# Patient Record
Sex: Male | Born: 1965 | Race: White | Hispanic: No | Marital: Married | Smoking: Never smoker
Health system: Southern US, Community
[De-identification: ages and names within clinical notes are randomized; demographics above are authoritative.]

## PROBLEM LIST (undated history)

## (undated) DIAGNOSIS — Z8669 Personal history of other diseases of the nervous system and sense organs: Secondary | ICD-10-CM

## (undated) DIAGNOSIS — E785 Hyperlipidemia, unspecified: Secondary | ICD-10-CM

## (undated) DIAGNOSIS — M51369 Other intervertebral disc degeneration, lumbar region without mention of lumbar back pain or lower extremity pain: Secondary | ICD-10-CM

## (undated) DIAGNOSIS — T7840XA Allergy, unspecified, initial encounter: Secondary | ICD-10-CM

## (undated) DIAGNOSIS — M5136 Other intervertebral disc degeneration, lumbar region: Secondary | ICD-10-CM

## (undated) HISTORY — DX: Hyperlipidemia, unspecified: E78.5

## (undated) HISTORY — DX: Other intervertebral disc degeneration, lumbar region: M51.36

## (undated) HISTORY — DX: Other intervertebral disc degeneration, lumbar region without mention of lumbar back pain or lower extremity pain: M51.369

## (undated) HISTORY — DX: Allergy, unspecified, initial encounter: T78.40XA

## (undated) HISTORY — PX: WISDOM TOOTH EXTRACTION: SHX21

## (undated) HISTORY — DX: Personal history of other diseases of the nervous system and sense organs: Z86.69

---

## 2004-08-01 ENCOUNTER — Emergency Department (HOSPITAL_COMMUNITY): Admission: EM | Admit: 2004-08-01 | Discharge: 2004-08-01 | Payer: Self-pay | Admitting: Internal Medicine

## 2005-08-18 IMAGING — CR DG FOREARM 2V*L*
3 series · 3 of 3 positions shown · non-contrast
Comparison: none

CLINICAL DATA: Question piece of Hans-Gert lodged in the soft tissues of the forearm.
 LEFT FOREARM (TWO VIEWS):
 Two view exam of the left forearm was obtained with a screw marking the location of the puncture wound.  Deep to this level is a 5 x 3 mm retained radiopaque foreign body.  No underlying bony defect identified.  
 IMPRESSION
 5 mm retained radiopaque foreign body within the anterolateral soft tissues of the proximal-mid forearm

[view not recorded (1 of 3)]
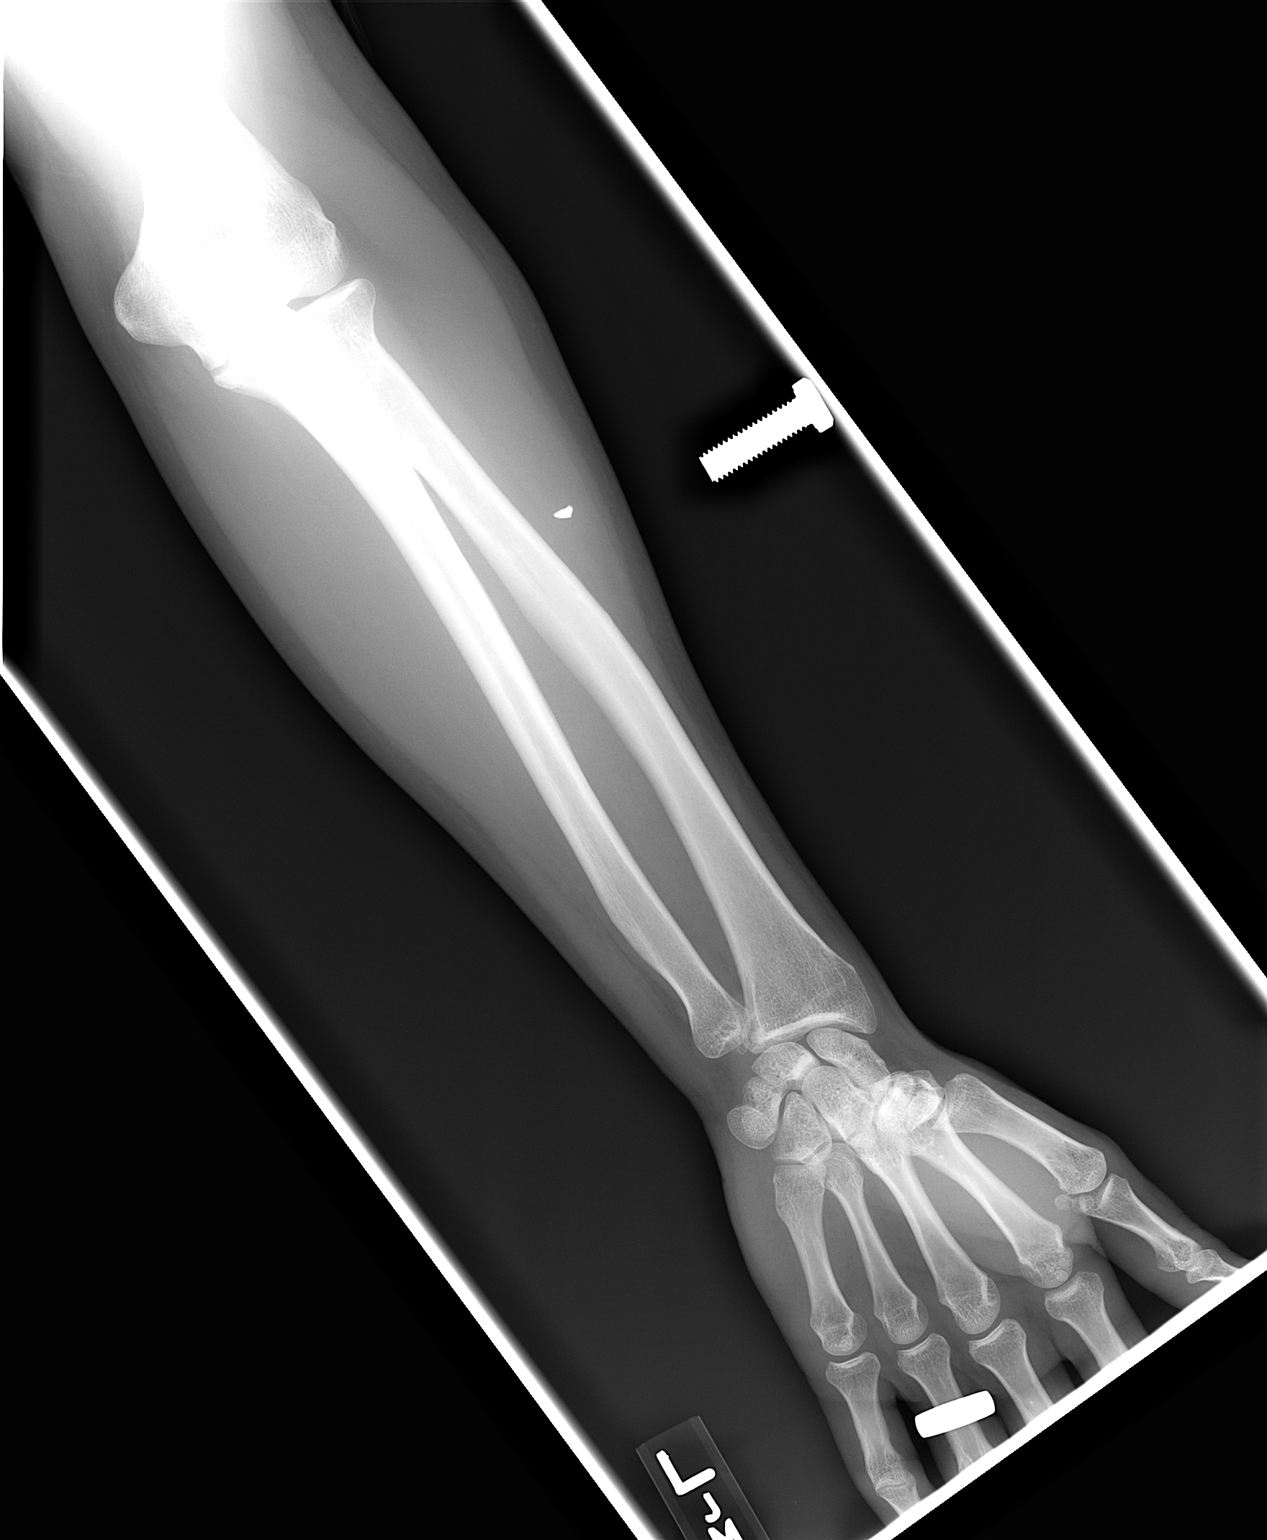

[view not recorded (2 of 3)]
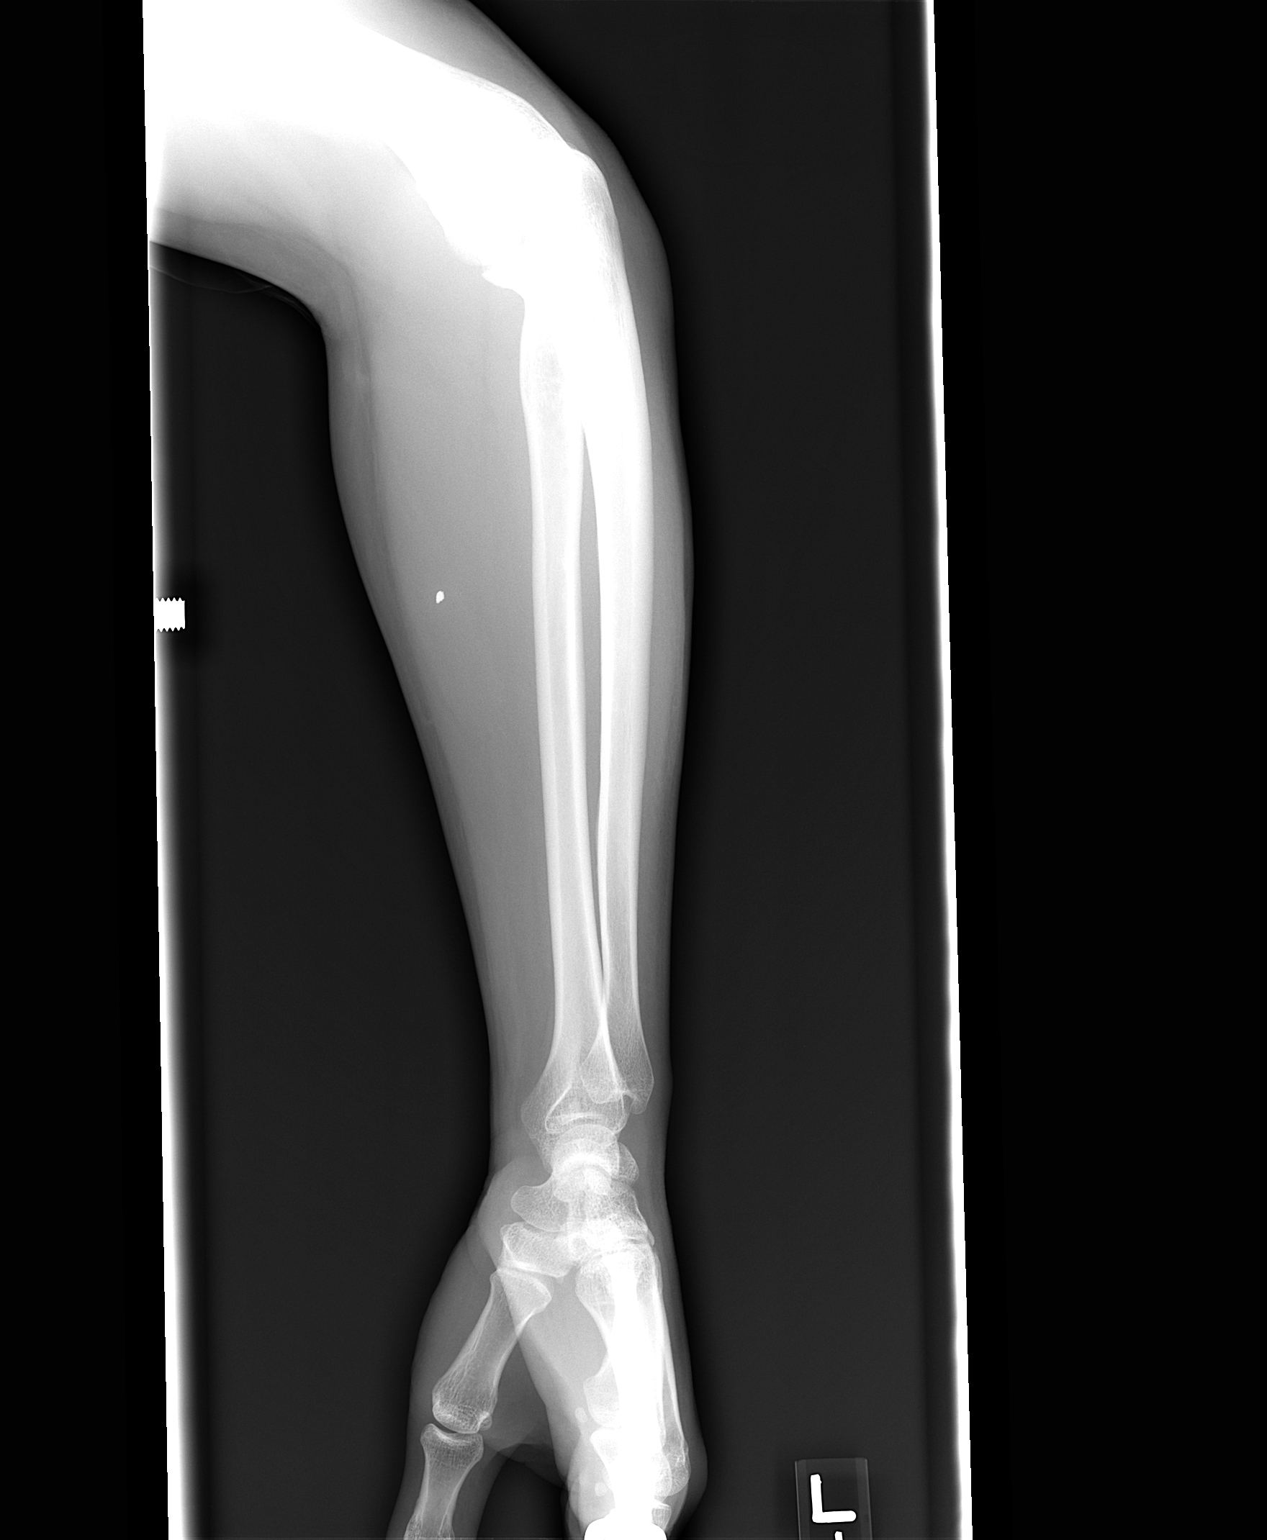

[view not recorded (3 of 3)]
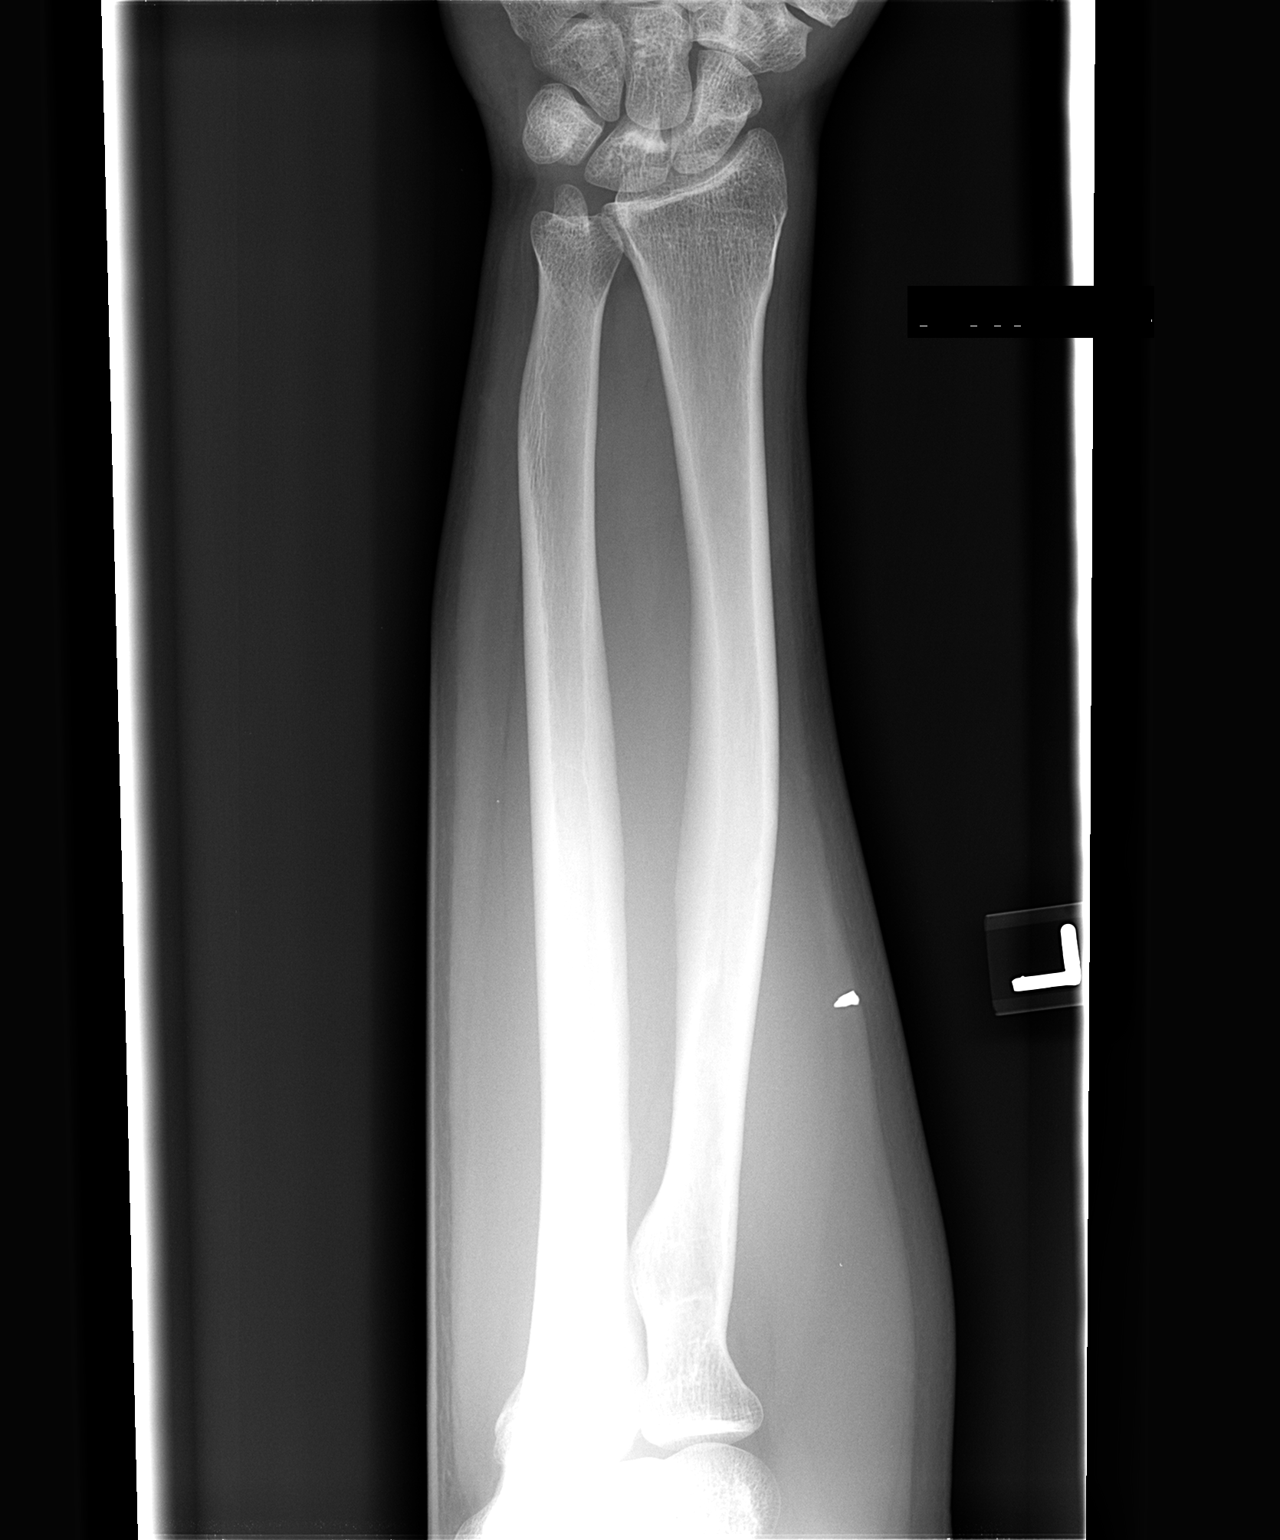

[3 of 3 positions shown; findings below may reference images not displayed]

## 2007-07-31 ENCOUNTER — Ambulatory Visit: Payer: Self-pay | Admitting: Internal Medicine

## 2009-05-02 ENCOUNTER — Ambulatory Visit: Payer: Self-pay | Admitting: Internal Medicine

## 2009-05-02 DIAGNOSIS — E782 Mixed hyperlipidemia: Secondary | ICD-10-CM | POA: Insufficient documentation

## 2009-05-03 ENCOUNTER — Encounter: Payer: Self-pay | Admitting: Internal Medicine

## 2009-05-06 ENCOUNTER — Encounter (INDEPENDENT_AMBULATORY_CARE_PROVIDER_SITE_OTHER): Payer: Self-pay | Admitting: *Deleted

## 2009-12-06 HISTORY — PX: BACK SURGERY: SHX140

## 2010-03-23 ENCOUNTER — Telehealth (INDEPENDENT_AMBULATORY_CARE_PROVIDER_SITE_OTHER): Payer: Self-pay | Admitting: *Deleted

## 2010-03-25 ENCOUNTER — Encounter (INDEPENDENT_AMBULATORY_CARE_PROVIDER_SITE_OTHER): Payer: Self-pay | Admitting: *Deleted

## 2010-09-14 ENCOUNTER — Ambulatory Visit (HOSPITAL_COMMUNITY)
Admission: RE | Admit: 2010-09-14 | Discharge: 2010-09-14 | Payer: Self-pay | Admitting: Physical Medicine and Rehabilitation

## 2011-01-03 LAB — CONVERTED CEMR LAB
ALT: 20 units/L (ref 0–53)
ALT: 29 units/L (ref 0–53)
AST: 23 units/L (ref 0–37)
AST: 27 units/L (ref 0–37)
Albumin: 4.2 g/dL (ref 3.5–5.2)
Basophils Absolute: 0.1 10*3/uL (ref 0.0–0.1)
Bilirubin Urine: NEGATIVE
Blood in Urine, dipstick: NEGATIVE
CO2: 24 meq/L (ref 19–32)
Calcium: 10.1 mg/dL (ref 8.4–10.5)
Chloride: 102 meq/L (ref 96–112)
Chloride: 103 meq/L (ref 96–112)
Creatinine, Ser: 0.9 mg/dL (ref 0.40–1.50)
Eosinophils Absolute: 0.1 10*3/uL (ref 0.0–0.6)
Eosinophils Absolute: 0.1 10*3/uL (ref 0.0–0.7)
Eosinophils Relative: 2 % (ref 0–5)
Eosinophils Relative: 2.3 % (ref 0.0–5.0)
GFR calc non Af Amer: 114 mL/min
Glucose, Bld: 96 mg/dL (ref 70–99)
HCT: 44.3 % (ref 39.0–52.0)
HDL: 47.1 mg/dL (ref >39.0)
Indirect Bilirubin: 1.4 mg/dL — ABNORMAL HIGH (ref 0.0–0.9)
Lymphs Abs: 2.1 10*3/uL (ref 0.7–4.0)
MCV: 83.3 fL (ref 78.0–100.0)
MCV: 86.5 fL (ref 78.0–100.0)
Monocytes Relative: 9 % (ref 3–12)
Neutrophils Relative %: 58 % (ref 43–77)
Platelets: 200 10*3/uL (ref 150–400)
Potassium: 4.2 meq/L (ref 3.5–5.3)
Protein, U semiquant: NEGATIVE
RBC: 5.07 M/uL (ref 4.22–5.81)
RBC: 5.32 M/uL (ref 4.22–5.81)
Total CHOL/HDL Ratio: 4.4
Total Protein: 7.5 g/dL (ref 6.0–8.3)
Triglycerides: 83 mg/dL (ref 0–149)
Urobilinogen, UA: 0.2
WBC: 5.5 10*3/uL (ref 4.5–10.5)
WBC: 6.8 10*3/uL (ref 4.0–10.5)

## 2011-01-06 NOTE — Progress Notes (Signed)
Summary: CALL-A-NURSE letter mailed unable to contact pt  Phone Note Outgoing Call   Call placed by: Jeremy Johann CMA,  March 23, 2010 8:38 AM Summary of Call: [facility] Triage Call Report Triage Record Num: [id number] Operator: Dayton Martes Patient Name: Marcus [facility] Call Date & Time: 03/21/2010 11:27:53AM Patient Phone: [phone] PCP: Marga Melnick Patient Gender: Male PCP Fax : Patient DOB: 19-Jan-1966 Practice Name: [facility] Reason for Call: Wife/Susan sts that pt has yellow discharge from the L eye and the sclera is red, eyelid is moderately red and severely swollen. Afebrile. Denies urgent emergent sx's. Care adv given. Adv UC now. 1145-Notified Dr. Tawanna Cooler of the above. T.O. Adv that the office policy is that no meds are called in without pt being seen and he will need to be seen in UC. RN adv pt and wife per T.O. Protocol(s) Used: Eye: Infection / Irritation Recommended Outcome per Protocol: See Provider within 4 hours Reason for Outcome: Pain with tenderness, swelling or redness above or below eyes or near ears over sinuses Care Advice:  ~ Another adult should drive if vision is impaired.  ~ Call provider if symptoms worsen or new symptoms develop.  ~ List, or take, all current prescription(s), OTC or alternative medication(s) to provider for evaluation.  ~ SYMPTOM / CONDITION MANAGEMENT 04/  Follow-up for Phone Call        left message to call office...........Marland KitchenFelecia Deloach CMA  March 23, 2010 8:41 AM   left message to call office at work number and number taking by call nurse to have pt return call. Pt home phone number with constant busy signal .Jeremy Johann CMA  March 23, 2010 12:35 PM + left message to call  office............Marland KitchenFelecia Deloach CMA  March 24, 2010 8:51 AM  left message to call office. tried to contact pt several times with no return call. letter mailed informing pt to call office if still need help with this  matter............Marland KitchenFelecia Deloach CMA  March 25, 2010 9:19 AM

## 2011-01-06 NOTE — Letter (Signed)
Summary: Generic Letter  [facility]  [address] [place], [place] [address]   Phone: [phone]  Fax: [phone]    03/25/2010    [facility] [address] [place], [place]  [address]    Dear Mr. Dungee,    This letter is to inform you of our several attempts to contact you. The initial call was in regards to a message taken by our on call service.If this matter in which call was originally in reference to is still a issue please contact our office so that we may assist you with this matter.           Sincerely,       Marga Melnick, MD

## 2011-07-16 ENCOUNTER — Encounter: Payer: Self-pay | Admitting: Internal Medicine

## 2011-10-01 IMAGING — CR DG ORBITS COMPLETE 4+V
2 series · 2 of 2 positions shown · non-contrast
Comparison: None

CLINICAL DATA: Metal working/exposure; clearance prior to MRI

ORBITS - COMPLETE 4+ VIEW

[w waters (1 of 2)]
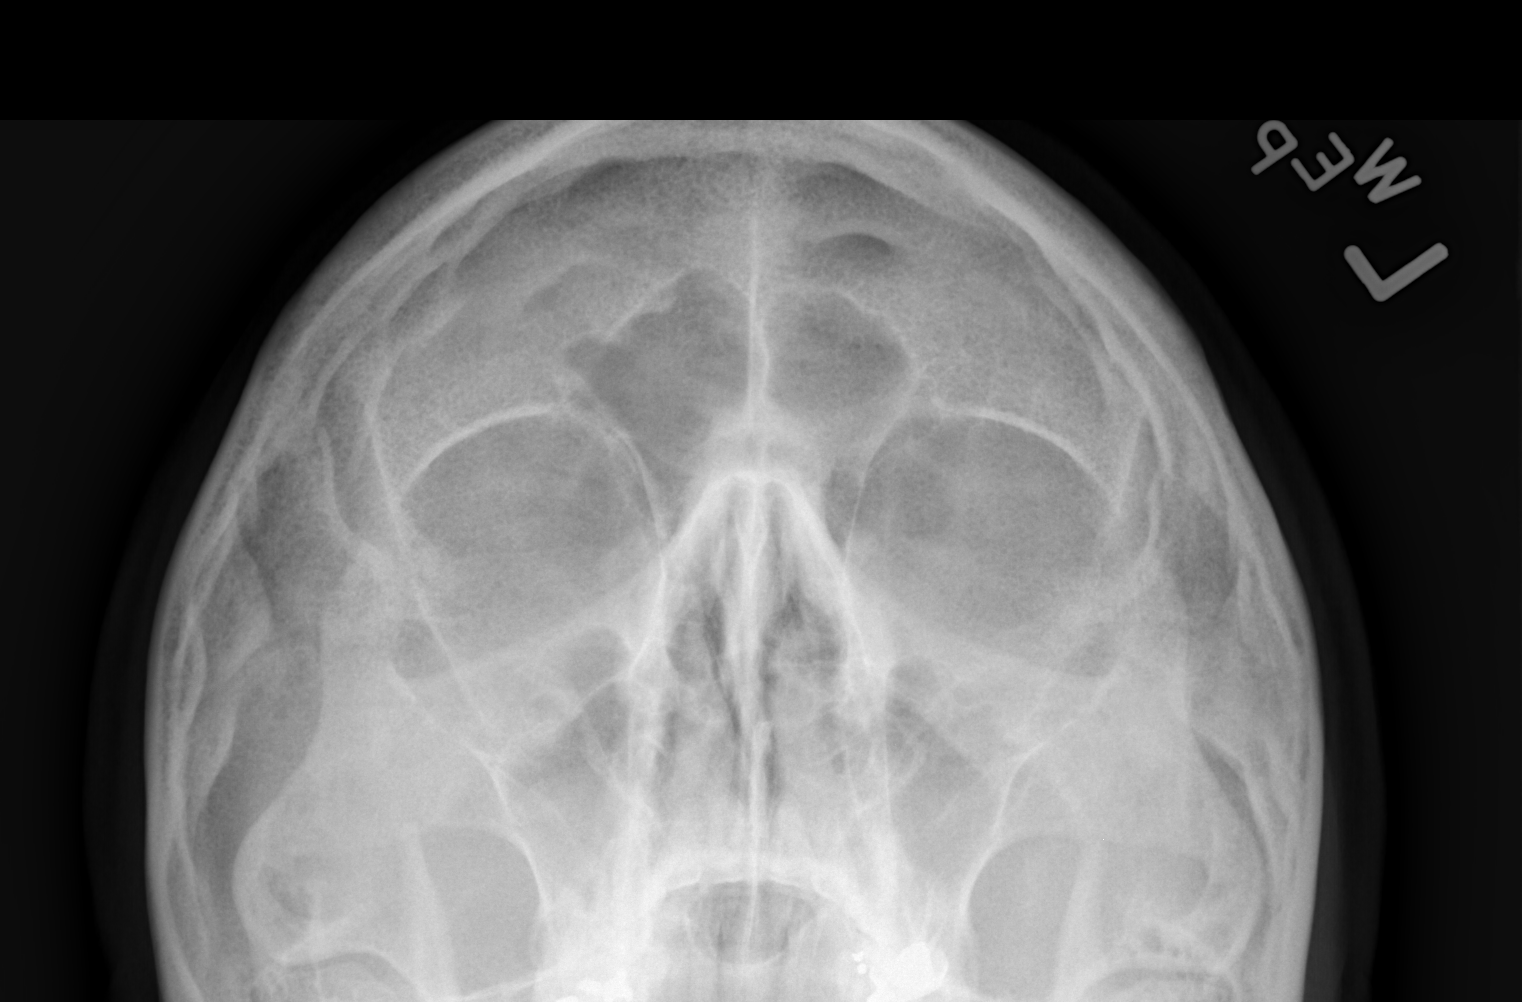

[w waters (2 of 2)]
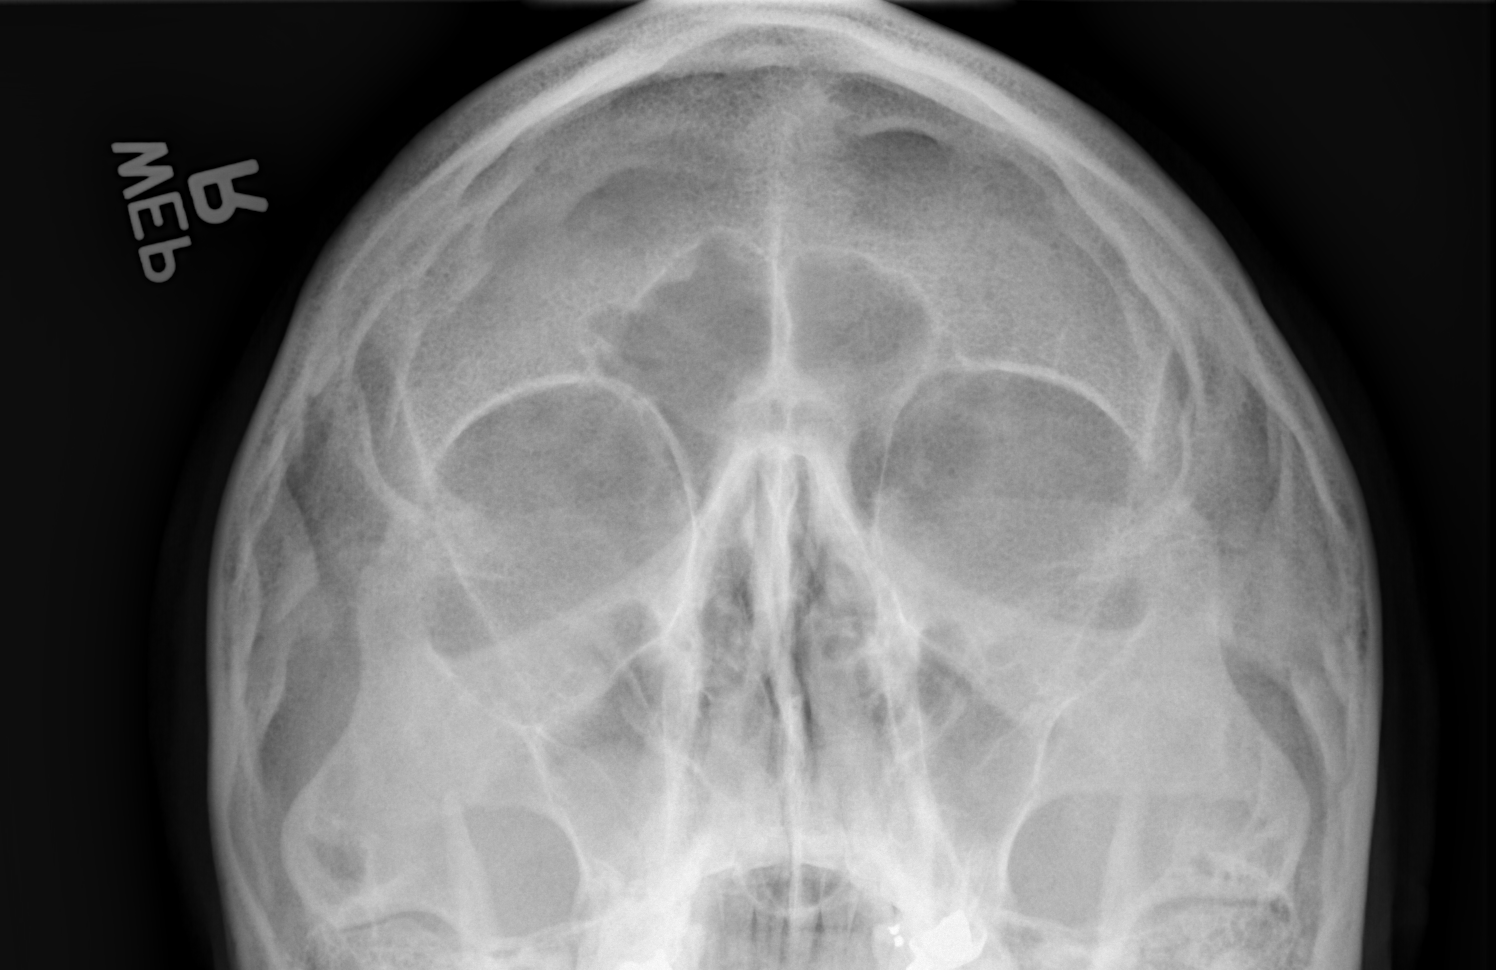

[2 of 2 positions shown; findings below may reference images not displayed]

FINDINGS: There is no evidence of metallic foreign body within the
orbits.  No significant bone abnormality identified.
IMPRESSION: No evidence of metallic foreign body within the orbits.

## 2013-07-17 DIAGNOSIS — M5416 Radiculopathy, lumbar region: Secondary | ICD-10-CM | POA: Insufficient documentation

## 2013-07-17 DIAGNOSIS — M5126 Other intervertebral disc displacement, lumbar region: Secondary | ICD-10-CM | POA: Insufficient documentation

## 2013-07-17 DIAGNOSIS — M5136 Other intervertebral disc degeneration, lumbar region: Secondary | ICD-10-CM | POA: Insufficient documentation

## 2016-12-17 ENCOUNTER — Encounter: Payer: Self-pay | Admitting: Physician Assistant

## 2016-12-17 ENCOUNTER — Ambulatory Visit (INDEPENDENT_AMBULATORY_CARE_PROVIDER_SITE_OTHER): Payer: BLUE CROSS/BLUE SHIELD | Admitting: Physician Assistant

## 2016-12-17 VITALS — BP 112/70 | HR 63 | Temp 97.9°F | Resp 16 | Ht 71.0 in | Wt 200.0 lb

## 2016-12-17 DIAGNOSIS — Z0000 Encounter for general adult medical examination without abnormal findings: Secondary | ICD-10-CM | POA: Diagnosis not present

## 2016-12-17 DIAGNOSIS — E663 Overweight: Secondary | ICD-10-CM

## 2016-12-17 DIAGNOSIS — Z09 Encounter for follow-up examination after completed treatment for conditions other than malignant neoplasm: Secondary | ICD-10-CM | POA: Insufficient documentation

## 2016-12-17 LAB — LIPID PANEL
Cholesterol: 248 mg/dL — ABNORMAL HIGH (ref 0–200)
HDL: 52 mg/dL (ref >39.00)
LDL Cholesterol: 168 mg/dL — ABNORMAL HIGH (ref 0–99)
NONHDL: 195.7
TRIGLYCERIDES: 138 mg/dL (ref 0.0–149.0)
Total CHOL/HDL Ratio: 5
VLDL: 27.6 mg/dL (ref 0.0–40.0)

## 2016-12-17 LAB — COMPREHENSIVE METABOLIC PANEL
ALBUMIN: 4.5 g/dL (ref 3.5–5.2)
ALK PHOS: 55 U/L (ref 39–117)
ALT: 52 U/L (ref 0–53)
AST: 35 U/L (ref 0–37)
BILIRUBIN TOTAL: 1.3 mg/dL — AB (ref 0.2–1.2)
BUN: 13 mg/dL (ref 6–23)
CO2: 30 mEq/L (ref 19–32)
Calcium: 10.1 mg/dL (ref 8.4–10.5)
Chloride: 103 mEq/L (ref 96–112)
Creatinine, Ser: 0.86 mg/dL (ref 0.40–1.50)
GFR: 100.64 mL/min (ref >60.00)
GLUCOSE: 112 mg/dL — AB (ref 70–99)
POTASSIUM: 4.7 meq/L (ref 3.5–5.1)
Sodium: 141 mEq/L (ref 135–145)
TOTAL PROTEIN: 7 g/dL (ref 6.0–8.3)

## 2016-12-17 LAB — URINALYSIS, ROUTINE W REFLEX MICROSCOPIC
BILIRUBIN URINE: NEGATIVE
HGB URINE DIPSTICK: NEGATIVE
Ketones, ur: NEGATIVE
LEUKOCYTES UA: NEGATIVE
NITRITE: NEGATIVE
Specific Gravity, Urine: <=1.005 — AB (ref 1.000–1.030)
Total Protein, Urine: NEGATIVE
UROBILINOGEN UA: 0.2 (ref 0.0–1.0)
Urine Glucose: NEGATIVE
WBC UA: NONE SEEN — AB (ref 0–?)
pH: 6 (ref 5.0–8.0)

## 2016-12-17 LAB — HEMOGLOBIN A1C: Hgb A1c MFr Bld: 6 % (ref 4.6–6.5)

## 2016-12-17 LAB — CBC
HCT: 45 % (ref 39.0–52.0)
HEMOGLOBIN: 15.5 g/dL (ref 13.0–17.0)
MCHC: 34.4 g/dL (ref 30.0–36.0)
MCV: 84.7 fl (ref 78.0–100.0)
Platelets: 230 10*3/uL (ref 150.0–400.0)
RBC: 5.32 Mil/uL (ref 4.22–5.81)
RDW: 13.6 % (ref 11.5–15.5)
WBC: 6.1 10*3/uL (ref 4.0–10.5)

## 2016-12-17 LAB — TSH: TSH: 1.62 u[IU]/mL (ref 0.35–4.50)

## 2016-12-17 NOTE — Patient Instructions (Signed)
Please go to the lab for blood work.   Our office will call you with your results unless you have chosen to receive results via MyChart.  If your blood work is normal we will follow-up each year for physicals and as scheduled for chronic medical problems.  If anything is abnormal we will treat accordingly and get you in for a follow-up.  Please work on a well-balanced diet.  Lean protein, fruits and vegetables.     Preventive Care 40-64 Years, Male Preventive care refers to lifestyle choices and visits with your health care provider that can promote health and wellness. What does preventive care include?  A yearly physical exam. This is also called an annual well check.  Dental exams once or twice a year.  Routine eye exams. Ask your health care provider how often you should have your eyes checked.  Personal lifestyle choices, including:  Daily care of your teeth and gums.  Regular physical activity.  Eating a healthy diet.  Avoiding tobacco and drug use.  Limiting alcohol use.  Practicing safe sex.  Taking low-dose aspirin every day starting at age 32. What happens during an annual well check? The services and screenings done by your health care provider during your annual well check will depend on your age, overall health, lifestyle risk factors, and family history of disease. Counseling  Your health care provider may ask you questions about your:  Alcohol use.  Tobacco use.  Drug use.  Emotional well-being.  Home and relationship well-being.  Sexual activity.  Eating habits.  Work and work Statistician. Screening  You may have the following tests or measurements:  Height, weight, and BMI.  Blood pressure.  Lipid and cholesterol levels. These may be checked every 5 years, or more frequently if you are over 53 years old.  Skin check.  Lung cancer screening. You may have this screening every year starting at age 26 if you have a 30-pack-year history  of smoking and currently smoke or have quit within the past 15 years.  Fecal occult blood test (FOBT) of the stool. You may have this test every year starting at age 34.  Flexible sigmoidoscopy or colonoscopy. You may have a sigmoidoscopy every 5 years or a colonoscopy every 10 years starting at age 24.  Prostate cancer screening. Recommendations will vary depending on your family history and other risks.  Hepatitis C blood test.  Hepatitis B blood test.  Sexually transmitted disease (STD) testing.  Diabetes screening. This is done by checking your blood sugar (glucose) after you have not eaten for a while (fasting). You may have this done every 1-3 years. Discuss your test results, treatment options, and if necessary, the need for more tests with your health care provider. Vaccines  Your health care provider may recommend certain vaccines, such as:  Influenza vaccine. This is recommended every year.  Tetanus, diphtheria, and acellular pertussis (Tdap, Td) vaccine. You may need a Td booster every 10 years.  Varicella vaccine. You may need this if you have not been vaccinated.  Zoster vaccine. You may need this after age 46.  Measles, mumps, and rubella (MMR) vaccine. You may need at least one dose of MMR if you were born in 1957 or later. You may also need a second dose.  Pneumococcal 13-valent conjugate (PCV13) vaccine. You may need this if you have certain conditions and have not been vaccinated.  Pneumococcal polysaccharide (PPSV23) vaccine. You may need one or two doses if you smoke cigarettes or  if you have certain conditions.  Meningococcal vaccine. You may need this if you have certain conditions.  Hepatitis A vaccine. You may need this if you have certain conditions or if you travel or work in places where you may be exposed to hepatitis A.  Hepatitis B vaccine. You may need this if you have certain conditions or if you travel or work in places where you may be exposed to  hepatitis B.  Haemophilus influenzae type b (Hib) vaccine. You may need this if you have certain risk factors. Talk to your health care provider about which screenings and vaccines you need and how often you need them. This information is not intended to replace advice given to you by your health care provider. Make sure you discuss any questions you have with your health care provider. Document Released: 12/19/2015 Document Revised: 08/11/2016 Document Reviewed: 09/23/2015 Elsevier Interactive Patient Education  2017 [employer].

## 2016-12-17 NOTE — Progress Notes (Signed)
Patient presents to clinic today to establish care. Patient requesting CPE today if possible. Is fasting for labs. Endorses well-balanced diet overall. Eats out a lot. Endorses good water intake. Maybe 1 soda per day. Body mass index is 27.89 kg/m. Endorses walking and riding back several times per week.   Acute Concerns: Denies acute concerns today.   Chronic Issues: Seasonal Allergies -- occasional use of zyrtec for allergy symptoms.   Health Maintenance: Immunizations -- Flu and Tetanus up-to-date.  HIV -- previously checked at work. Negative. Denies concern for recheck.   Past Medical History:  Diagnosis Date  . Allergy    seasonal    Past Surgical History:  Procedure Laterality Date  . BACK SURGERY  2011   repair of L5 disc    No current outpatient prescriptions on file prior to visit.   No current facility-administered medications on file prior to visit.     No Known Allergies  Family History  Problem Relation Age of Onset  . Diabetes Father   . Cancer Maternal Grandmother     Breast  . Heart attack Maternal Grandmother 60    was a smoker   . Healthy Child     x 2 -- 1 boy and 1 girl    Social History   Social History  . Marital status: Married    Spouse name: Darl PikesSusan  . Number of children: 2  . Years of education: N/A   Occupational History  . International aid/development workerMechanic     [employer]   Social History Main Topics  . Smoking status: Never Smoker  . Smokeless tobacco: Never Used  . Alcohol use No  . Drug use: No  . Sexual activity: Yes    Partners: Female     Comment: wife   Other Topics Concern  . Not on file   Social History Narrative   [place]   Review of Systems  Constitutional: Negative for fever and weight loss.  HENT: Negative for ear discharge, ear pain, hearing loss and tinnitus.   Eyes: Negative for blurred vision, double vision, photophobia and pain.  Respiratory: Negative for cough and shortness of breath.     Cardiovascular: Negative for chest pain and palpitations.  Gastrointestinal: Negative for abdominal pain, blood in stool, constipation, diarrhea, heartburn, melena, nausea and vomiting.  Genitourinary: Negative for dysuria, flank pain, frequency, hematuria and urgency.  Musculoskeletal: Negative for falls.  Neurological: Negative for dizziness, loss of consciousness and headaches.  Endo/Heme/Allergies: Negative for environmental allergies.  Psychiatric/Behavioral: Negative for depression, hallucinations, substance abuse and suicidal ideas. The patient is not nervous/anxious and does not have insomnia.    BP 112/70   Pulse 63   Temp 97.9 F (36.6 C) (Oral)   Resp 16   Ht 5\' 11"  (1.803 m)   Wt 200 lb (90.7 kg)   SpO2 97%   BMI 27.89 kg/m   Physical Exam  Constitutional: He is oriented to person, place, and time and well-developed, well-nourished, and in no distress.  HENT:  Head: Normocephalic and atraumatic.  Right Ear: External ear normal.  Left Ear: External ear normal.  Nose: Nose normal.  Mouth/Throat: Oropharynx is clear and moist. No oropharyngeal exudate.  Eyes: Conjunctivae and EOM are normal. Pupils are equal, round, and reactive to light.  Neck: Neck supple. No thyromegaly present.  Cardiovascular: Normal rate, regular rhythm, normal heart sounds and intact distal pulses.   Pulmonary/Chest: Effort normal and breath sounds normal. No respiratory distress. He  has no wheezes. He has no rales. He exhibits no tenderness.  Abdominal: Soft. Bowel sounds are normal. He exhibits no distension and no mass. There is no tenderness. There is no rebound and no guarding.  Genitourinary: Testes/scrotum normal.  Lymphadenopathy:    He has no cervical adenopathy.  Neurological: He is alert and oriented to person, place, and time.  Skin: Skin is warm and dry. No rash noted.  Psychiatric: Affect normal.  Vitals reviewed.  Assessment/Plan: Visit for preventive health  examination Depression screen negative. Health Maintenance reviewed -- Immunizations up-to-date. Declines HIV screening. Preventive schedule discussed and handout given in AVS. Will obtain fasting labs today.   Overweight (BMI 25.0-29.9) Body mass index is 27.89 kg/m. Discussed diet and exercise recommendations.  Will monitor.     Piedad Climes, PA-C

## 2016-12-17 NOTE — Progress Notes (Signed)
Pre visit review using our clinic review tool, if applicable. No additional management support is needed unless otherwise documented below in the visit note. 

## 2016-12-18 NOTE — Assessment & Plan Note (Signed)
Body mass index is 27.89 kg/m. Discussed diet and exercise recommendations.  Will monitor.

## 2016-12-18 NOTE — Assessment & Plan Note (Signed)
Depression screen negative. Health Maintenance reviewed -- Immunizations up-to-date. Declines HIV screening. Preventive schedule discussed and handout given in AVS. Will obtain fasting labs today.

## 2016-12-20 ENCOUNTER — Encounter: Payer: Self-pay | Admitting: Physician Assistant

## 2016-12-21 ENCOUNTER — Other Ambulatory Visit: Payer: Self-pay | Admitting: Physician Assistant

## 2016-12-21 DIAGNOSIS — E782 Mixed hyperlipidemia: Secondary | ICD-10-CM

## 2016-12-21 DIAGNOSIS — R7309 Other abnormal glucose: Secondary | ICD-10-CM

## 2017-03-14 ENCOUNTER — Encounter: Payer: Self-pay | Admitting: Physician Assistant

## 2017-03-15 MED ORDER — ATORVASTATIN CALCIUM 10 MG PO TABS: 10.0000 mg | ORAL_TABLET | Freq: Every day | ORAL | 0 refills | Status: DC

## 2017-08-22 DIAGNOSIS — M961 Postlaminectomy syndrome, not elsewhere classified: Secondary | ICD-10-CM | POA: Insufficient documentation

## 2017-10-12 DIAGNOSIS — E782 Mixed hyperlipidemia: Secondary | ICD-10-CM | POA: Diagnosis not present

## 2017-10-12 DIAGNOSIS — R945 Abnormal results of liver function studies: Secondary | ICD-10-CM | POA: Diagnosis not present

## 2017-10-18 DIAGNOSIS — R748 Abnormal levels of other serum enzymes: Secondary | ICD-10-CM | POA: Diagnosis not present

## 2017-10-20 DIAGNOSIS — R945 Abnormal results of liver function studies: Secondary | ICD-10-CM | POA: Diagnosis not present

## 2017-10-20 DIAGNOSIS — R748 Abnormal levels of other serum enzymes: Secondary | ICD-10-CM | POA: Diagnosis not present

## 2017-10-20 DIAGNOSIS — K8020 Calculus of gallbladder without cholecystitis without obstruction: Secondary | ICD-10-CM | POA: Diagnosis not present

## 2017-11-09 DIAGNOSIS — E782 Mixed hyperlipidemia: Secondary | ICD-10-CM | POA: Diagnosis not present

## 2017-11-09 DIAGNOSIS — R945 Abnormal results of liver function studies: Secondary | ICD-10-CM | POA: Diagnosis not present

## 2017-12-07 DIAGNOSIS — R945 Abnormal results of liver function studies: Secondary | ICD-10-CM | POA: Diagnosis not present

## 2017-12-07 DIAGNOSIS — E782 Mixed hyperlipidemia: Secondary | ICD-10-CM | POA: Diagnosis not present

## 2017-12-28 DIAGNOSIS — R740 Nonspecific elevation of levels of transaminase and lactic acid dehydrogenase [LDH]: Secondary | ICD-10-CM | POA: Diagnosis not present

## 2018-04-26 ENCOUNTER — Encounter: Payer: Self-pay | Admitting: Emergency Medicine

## 2018-05-10 DIAGNOSIS — Z131 Encounter for screening for diabetes mellitus: Secondary | ICD-10-CM | POA: Diagnosis not present

## 2018-05-10 DIAGNOSIS — Z0000 Encounter for general adult medical examination without abnormal findings: Secondary | ICD-10-CM | POA: Diagnosis not present

## 2018-05-10 DIAGNOSIS — Z125 Encounter for screening for malignant neoplasm of prostate: Secondary | ICD-10-CM | POA: Diagnosis not present

## 2018-05-10 DIAGNOSIS — E782 Mixed hyperlipidemia: Secondary | ICD-10-CM | POA: Diagnosis not present

## 2018-09-19 DIAGNOSIS — M7711 Lateral epicondylitis, right elbow: Secondary | ICD-10-CM | POA: Diagnosis not present

## 2018-09-19 DIAGNOSIS — M79601 Pain in right arm: Secondary | ICD-10-CM | POA: Diagnosis not present

## 2018-12-21 DIAGNOSIS — E782 Mixed hyperlipidemia: Secondary | ICD-10-CM | POA: Diagnosis not present

## 2018-12-21 DIAGNOSIS — Z0000 Encounter for general adult medical examination without abnormal findings: Secondary | ICD-10-CM | POA: Diagnosis not present

## 2018-12-21 DIAGNOSIS — R945 Abnormal results of liver function studies: Secondary | ICD-10-CM | POA: Diagnosis not present

## 2018-12-28 DIAGNOSIS — R945 Abnormal results of liver function studies: Secondary | ICD-10-CM | POA: Diagnosis not present

## 2018-12-28 DIAGNOSIS — Z0000 Encounter for general adult medical examination without abnormal findings: Secondary | ICD-10-CM | POA: Diagnosis not present

## 2018-12-28 DIAGNOSIS — E782 Mixed hyperlipidemia: Secondary | ICD-10-CM | POA: Diagnosis not present

## 2018-12-28 DIAGNOSIS — R7303 Prediabetes: Secondary | ICD-10-CM | POA: Diagnosis not present

## 2019-01-28 DIAGNOSIS — Z1212 Encounter for screening for malignant neoplasm of rectum: Secondary | ICD-10-CM | POA: Diagnosis not present

## 2019-01-28 DIAGNOSIS — Z1211 Encounter for screening for malignant neoplasm of colon: Secondary | ICD-10-CM | POA: Diagnosis not present

## 2019-02-01 LAB — COLOGUARD: Cologuard: NEGATIVE

## 2019-07-11 DIAGNOSIS — Z0000 Encounter for general adult medical examination without abnormal findings: Secondary | ICD-10-CM | POA: Diagnosis not present

## 2019-07-11 DIAGNOSIS — Z125 Encounter for screening for malignant neoplasm of prostate: Secondary | ICD-10-CM | POA: Diagnosis not present

## 2019-07-11 DIAGNOSIS — E782 Mixed hyperlipidemia: Secondary | ICD-10-CM | POA: Diagnosis not present

## 2019-07-11 DIAGNOSIS — R7303 Prediabetes: Secondary | ICD-10-CM | POA: Diagnosis not present

## 2019-07-11 DIAGNOSIS — Z131 Encounter for screening for diabetes mellitus: Secondary | ICD-10-CM | POA: Diagnosis not present

## 2019-07-12 DIAGNOSIS — R945 Abnormal results of liver function studies: Secondary | ICD-10-CM | POA: Diagnosis not present

## 2019-07-12 DIAGNOSIS — R7303 Prediabetes: Secondary | ICD-10-CM | POA: Diagnosis not present

## 2019-07-12 DIAGNOSIS — E782 Mixed hyperlipidemia: Secondary | ICD-10-CM | POA: Diagnosis not present

## 2019-11-25 DIAGNOSIS — Z20828 Contact with and (suspected) exposure to other viral communicable diseases: Secondary | ICD-10-CM | POA: Diagnosis not present

## 2020-01-22 DIAGNOSIS — Z0000 Encounter for general adult medical examination without abnormal findings: Secondary | ICD-10-CM | POA: Diagnosis not present

## 2020-01-22 DIAGNOSIS — R7303 Prediabetes: Secondary | ICD-10-CM | POA: Diagnosis not present

## 2020-01-22 DIAGNOSIS — E782 Mixed hyperlipidemia: Secondary | ICD-10-CM | POA: Diagnosis not present

## 2020-01-22 DIAGNOSIS — Z125 Encounter for screening for malignant neoplasm of prostate: Secondary | ICD-10-CM | POA: Diagnosis not present

## 2020-08-13 DIAGNOSIS — R7303 Prediabetes: Secondary | ICD-10-CM | POA: Diagnosis not present

## 2020-08-13 DIAGNOSIS — E782 Mixed hyperlipidemia: Secondary | ICD-10-CM | POA: Diagnosis not present

## 2022-03-05 DIAGNOSIS — Z0000 Encounter for general adult medical examination without abnormal findings: Secondary | ICD-10-CM | POA: Diagnosis not present

## 2022-03-05 DIAGNOSIS — Z125 Encounter for screening for malignant neoplasm of prostate: Secondary | ICD-10-CM | POA: Diagnosis not present

## 2022-09-29 ENCOUNTER — Emergency Department (HOSPITAL_BASED_OUTPATIENT_CLINIC_OR_DEPARTMENT_OTHER): Payer: 59

## 2022-09-29 ENCOUNTER — Emergency Department (HOSPITAL_BASED_OUTPATIENT_CLINIC_OR_DEPARTMENT_OTHER)
Admission: EM | Admit: 2022-09-29 | Discharge: 2022-09-29 | Disposition: A | Discharge status: Home / Self Care | Payer: 59 | Attending: Emergency Medicine | Admitting: Emergency Medicine

## 2022-09-29 DIAGNOSIS — R519 Headache, unspecified: Secondary | ICD-10-CM | POA: Insufficient documentation

## 2022-09-29 DIAGNOSIS — N200 Calculus of kidney: Secondary | ICD-10-CM | POA: Diagnosis not present

## 2022-09-29 DIAGNOSIS — M542 Cervicalgia: Secondary | ICD-10-CM | POA: Diagnosis not present

## 2022-09-29 DIAGNOSIS — M79602 Pain in left arm: Secondary | ICD-10-CM | POA: Diagnosis not present

## 2022-09-29 DIAGNOSIS — M4322 Fusion of spine, cervical region: Secondary | ICD-10-CM | POA: Diagnosis not present

## 2022-09-29 DIAGNOSIS — M47817 Spondylosis without myelopathy or radiculopathy, lumbosacral region: Secondary | ICD-10-CM | POA: Diagnosis not present

## 2022-09-29 DIAGNOSIS — Y92410 Unspecified street and highway as the place of occurrence of the external cause: Secondary | ICD-10-CM | POA: Diagnosis not present

## 2022-09-29 DIAGNOSIS — N281 Cyst of kidney, acquired: Secondary | ICD-10-CM | POA: Diagnosis not present

## 2022-09-29 DIAGNOSIS — M545 Low back pain, unspecified: Secondary | ICD-10-CM | POA: Insufficient documentation

## 2022-09-29 DIAGNOSIS — S199XXA Unspecified injury of neck, initial encounter: Secondary | ICD-10-CM | POA: Diagnosis not present

## 2022-09-29 DIAGNOSIS — Z041 Encounter for examination and observation following transport accident: Secondary | ICD-10-CM | POA: Diagnosis not present

## 2022-09-29 MED ORDER — METHOCARBAMOL 500 MG PO TABS: 500.0000 mg | ORAL_TABLET | Freq: Two times a day (BID) | ORAL | 0 refills | Status: DC

## 2022-09-29 MED ORDER — LIDOCAINE 5 % EX PTCH
1.0000 | MEDICATED_PATCH | Freq: Once | CUTANEOUS | Status: DC
  Administered 2022-09-29: 1 via TRANSDERMAL
  Filled 2022-09-29: qty 1

## 2022-09-29 MED ORDER — LIDOCAINE 5 % EX PTCH: 1.0000 | MEDICATED_PATCH | CUTANEOUS | 0 refills | Status: DC

## 2022-09-29 MED ORDER — IBUPROFEN 200 MG PO TABS
600.0000 mg | ORAL_TABLET | Freq: Once | ORAL | Status: AC
  Administered 2022-09-29: 600 mg via ORAL
  Filled 2022-09-29: qty 1
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
## 2022-09-29 NOTE — Discharge Instructions (Addendum)
It was a pleasure taking care of you today!  Your imaging in the ED was negative for fracture or dislocations. You will feel more sore in the morning. You are prescribed Robaxin (muscle relaxer). Do not drive or operate heavy machinery while taking the muscle relaxer.  You also be given a prescription for Lidoderm (lidocaine) patches, remove the patch after 12 hours and replace with a new patch.  You may take over the counter 600 mg Ibuprofen every 6 hours or 1,000 mg Tylenol every 6 hours as needed for pain. You may take over the counter 600 mg ibuprofen every 6 hours or 650 mg Tylenol every 6 hours for no more than 7 days. You may apply ice or heat to affected area for up to 15 minutes at a time. Ensure to place a barrier between your skin and the ice/heat. Return to the Emergency Department if you are experiencing increasing/worsening symptoms.

## 2023-01-24 ENCOUNTER — Telehealth: Payer: Self-pay

## 2023-01-24 NOTE — Telephone Encounter (Signed)
Yes, please make an appointment at his convenience.

## 2023-01-24 NOTE — Telephone Encounter (Signed)
Manuela Schwartz (spouse) called to ask if pt could look into establishing care with Dr. Larose Kells. She stated that he had a one-off visit with Dr. Larose Kells a while back and was really impressed with the quality of care he received. Please Advise.

## 2023-02-03 DIAGNOSIS — R7989 Other specified abnormal findings of blood chemistry: Secondary | ICD-10-CM | POA: Insufficient documentation

## 2023-02-08 ENCOUNTER — Ambulatory Visit: Payer: 59 | Admitting: Internal Medicine

## 2023-02-08 ENCOUNTER — Encounter: Payer: Self-pay | Admitting: Internal Medicine

## 2023-02-08 VITALS — BP 142/84 | HR 77 | Temp 98.0°F | Resp 18 | Ht 70.0 in | Wt 208.0 lb

## 2023-02-08 DIAGNOSIS — M542 Cervicalgia: Secondary | ICD-10-CM

## 2023-02-08 DIAGNOSIS — R200 Anesthesia of skin: Secondary | ICD-10-CM | POA: Diagnosis not present

## 2023-02-08 DIAGNOSIS — M549 Dorsalgia, unspecified: Secondary | ICD-10-CM

## 2023-02-08 NOTE — Patient Instructions (Signed)
Will refer you to neurology Dr. Everette Rank.  Recommend you to back for a complete physical exam at your earliest convenience.  You can make an appointment in the front desk
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
## 2023-03-15 ENCOUNTER — Telehealth: Payer: Self-pay | Admitting: Internal Medicine

## 2023-03-15 NOTE — Telephone Encounter (Signed)
Pt stated he called [facility] neuro and they have no received his referral.

## 2023-03-23 ENCOUNTER — Telehealth: Payer: Self-pay | Admitting: Internal Medicine

## 2023-03-23 NOTE — Telephone Encounter (Signed)
Pt said that [facility] still has not received the referral. Please resend to [phone]

## 2023-05-05 ENCOUNTER — Encounter: Payer: Self-pay | Admitting: Internal Medicine

## 2023-05-05 DIAGNOSIS — R202 Paresthesia of skin: Secondary | ICD-10-CM | POA: Diagnosis not present

## 2023-07-12 DIAGNOSIS — G5602 Carpal tunnel syndrome, left upper limb: Secondary | ICD-10-CM | POA: Diagnosis not present

## 2023-10-13 ENCOUNTER — Encounter: Payer: Self-pay | Admitting: Internal Medicine

## 2023-11-22 ENCOUNTER — Encounter: Payer: Self-pay | Admitting: Internal Medicine

## 2023-11-22 ENCOUNTER — Ambulatory Visit (INDEPENDENT_AMBULATORY_CARE_PROVIDER_SITE_OTHER): Payer: 59 | Admitting: Internal Medicine

## 2023-11-22 VITALS — BP 116/66 | HR 48 | Temp 98.0°F | Resp 16 | Ht 70.0 in | Wt 200.0 lb

## 2023-11-22 DIAGNOSIS — E782 Mixed hyperlipidemia: Secondary | ICD-10-CM | POA: Diagnosis not present

## 2023-11-22 DIAGNOSIS — Z0000 Encounter for general adult medical examination without abnormal findings: Secondary | ICD-10-CM
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
## 2023-11-22 NOTE — Patient Instructions (Addendum)
   GO TO THE LAB : Get the blood work     Next visit with me in 1 year for a physical exam    Please schedule it at the front desk        "Health Care Power of attorney" ,  "Living will" (Advance care planning documents)  If you already have a living will or healthcare power of attorney, is recommended you bring the copy to be scanned in your chart.   The document will be available to all the doctors you see in the system.  Advance care planning is a process that supports adults in  understanding and sharing their preferences regarding future medical care.  The patient's preferences are recorded in documents called Advance Directives and the can be modified at any time while the patient is in full mental capacity.   If you don't have one, please consider create one.      More information at: [email or web address]

## 2023-11-23 LAB — COMPREHENSIVE METABOLIC PANEL
ALT: 31 U/L (ref 0–53)
AST: 24 U/L (ref 0–37)
Albumin: 4.7 g/dL (ref 3.5–5.2)
Alkaline Phosphatase: 59 U/L (ref 39–117)
BUN: 14 mg/dL (ref 6–23)
CO2: 29 meq/L (ref 19–32)
Calcium: 9.6 mg/dL (ref 8.4–10.5)
Chloride: 101 meq/L (ref 96–112)
Creatinine, Ser: 0.8 mg/dL (ref 0.40–1.50)
GFR: 99.64 mL/min (ref >60.00)
Glucose, Bld: 94 mg/dL (ref 70–99)
Potassium: 4.3 meq/L (ref 3.5–5.1)
Sodium: 138 meq/L (ref 135–145)
Total Bilirubin: 1.5 mg/dL — ABNORMAL HIGH (ref 0.2–1.2)
Total Protein: 7.1 g/dL (ref 6.0–8.3)

## 2023-11-23 LAB — CBC WITH DIFFERENTIAL/PLATELET
Basophils Absolute: 0 10*3/uL (ref 0.0–0.1)
Basophils Relative: 0.6 % (ref 0.0–3.0)
Eosinophils Absolute: 0.2 10*3/uL (ref 0.0–0.7)
Eosinophils Relative: 2.3 % (ref 0.0–5.0)
HCT: 45.7 % (ref 39.0–52.0)
Hemoglobin: 15.3 g/dL (ref 13.0–17.0)
Lymphocytes Relative: 27.5 % (ref 12.0–46.0)
Lymphs Abs: 1.8 10*3/uL (ref 0.7–4.0)
MCHC: 33.3 g/dL (ref 30.0–36.0)
MCV: 88 fL (ref 78.0–100.0)
Monocytes Absolute: 0.7 10*3/uL (ref 0.1–1.0)
Monocytes Relative: 11.2 % (ref 3.0–12.0)
Neutro Abs: 3.8 10*3/uL (ref 1.4–7.7)
Neutrophils Relative %: 58.4 % (ref 43.0–77.0)
Platelets: 229 10*3/uL (ref 150.0–400.0)
RBC: 5.2 Mil/uL (ref 4.22–5.81)
RDW: 13.7 % (ref 11.5–15.5)
WBC: 6.5 10*3/uL (ref 4.0–10.5)

## 2023-11-23 LAB — LIPID PANEL
Cholesterol: 254 mg/dL — ABNORMAL HIGH (ref 0–200)
HDL: 60.9 mg/dL (ref >39.00)
LDL Cholesterol: 173 mg/dL — ABNORMAL HIGH (ref 0–99)
NonHDL: 193.4
Total CHOL/HDL Ratio: 4
Triglycerides: 103 mg/dL (ref 0.0–149.0)
VLDL: 20.6 mg/dL (ref 0.0–40.0)

## 2023-11-23 LAB — PSA: PSA: 4.49 ng/mL — ABNORMAL HIGH (ref 0.10–4.00)

## 2023-11-23 LAB — TSH: TSH: 1.71 u[IU]/mL (ref 0.35–5.50)

## 2023-11-25 ENCOUNTER — Encounter: Payer: Self-pay | Admitting: Internal Medicine

## 2023-11-25 DIAGNOSIS — Z0000 Encounter for general adult medical examination without abnormal findings: Secondary | ICD-10-CM | POA: Insufficient documentation

## 2023-11-25 NOTE — Assessment & Plan Note (Signed)
Here for CPX Dyslipidemia: Per chart review, currently diet controlled, checking labs Neck pain, left arm numbness: Saw neurology 07/12/2023, they recommended NCS/EMG and MRI of the cervical spine.  MRI is pending due to insurance issue, plans to proceed next year.  Symptoms at baseline. RTC 1 year
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
## 2023-11-27 ENCOUNTER — Encounter: Payer: Self-pay | Admitting: Internal Medicine

## 2024-02-21 ENCOUNTER — Telehealth: Payer: Self-pay | Admitting: *Deleted

## 2024-02-21 DIAGNOSIS — R972 Elevated prostate specific antigen [PSA]: Secondary | ICD-10-CM

## 2024-02-21 DIAGNOSIS — E782 Mixed hyperlipidemia: Secondary | ICD-10-CM

## 2024-02-21 NOTE — Telephone Encounter (Signed)
 Pt scheduled w/ you on 03/07/24 for 4 month f/u. Requesting labs prior to visit. Please advise?

## 2024-02-21 NOTE — Telephone Encounter (Signed)
 Patient has lab appointment on 03/01/24 and we have no lab orders.  Needing lab orders.

## 2024-02-22 NOTE — Telephone Encounter (Signed)
 Future orders placed

## 2024-02-22 NOTE — Telephone Encounter (Signed)
 Please enter the following orders: PSA, UA urine culture.  Elevated PSA. FLP, high cholesterol

## 2024-03-01 ENCOUNTER — Other Ambulatory Visit (INDEPENDENT_AMBULATORY_CARE_PROVIDER_SITE_OTHER): Payer: 59

## 2024-03-01 DIAGNOSIS — E782 Mixed hyperlipidemia: Secondary | ICD-10-CM

## 2024-03-01 DIAGNOSIS — R972 Elevated prostate specific antigen [PSA]: Secondary | ICD-10-CM

## 2024-03-01 LAB — URINALYSIS
Bilirubin Urine: NEGATIVE
Hgb urine dipstick: NEGATIVE
Ketones, ur: 15 — AB
Leukocytes,Ua: NEGATIVE
Nitrite: NEGATIVE
Specific Gravity, Urine: 1.025 (ref 1.000–1.030)
Total Protein, Urine: NEGATIVE
Urine Glucose: NEGATIVE
Urobilinogen, UA: 0.2 (ref 0.0–1.0)
pH: 6 (ref 5.0–8.0)

## 2024-03-02 LAB — LIPID PANEL
Cholesterol: 236 mg/dL — ABNORMAL HIGH (ref 0–200)
HDL: 58.1 mg/dL (ref >39.00)
LDL Cholesterol: 164 mg/dL — ABNORMAL HIGH (ref 0–99)
NonHDL: 178.25
Total CHOL/HDL Ratio: 4
Triglycerides: 72 mg/dL (ref 0.0–149.0)
VLDL: 14.4 mg/dL (ref 0.0–40.0)

## 2024-03-02 LAB — URINE CULTURE
MICRO NUMBER:: 16255864
Result:: NO GROWTH
SPECIMEN QUALITY:: ADEQUATE

## 2024-03-02 LAB — PSA: PSA: 4.54 ng/mL — ABNORMAL HIGH (ref 0.10–4.00)

## 2024-03-05 ENCOUNTER — Other Ambulatory Visit: Payer: Self-pay | Admitting: *Deleted

## 2024-03-05 ENCOUNTER — Encounter: Payer: Self-pay | Admitting: Internal Medicine

## 2024-03-05 DIAGNOSIS — R972 Elevated prostate specific antigen [PSA]: Secondary | ICD-10-CM

## 2024-03-07 ENCOUNTER — Ambulatory Visit (INDEPENDENT_AMBULATORY_CARE_PROVIDER_SITE_OTHER): Payer: 59 | Admitting: Internal Medicine

## 2024-03-07 ENCOUNTER — Encounter: Payer: Self-pay | Admitting: Internal Medicine

## 2024-03-07 VITALS — BP 126/76 | HR 67 | Temp 98.1°F | Resp 16 | Ht 70.0 in | Wt 203.4 lb

## 2024-03-07 DIAGNOSIS — Z8669 Personal history of other diseases of the nervous system and sense organs: Secondary | ICD-10-CM | POA: Insufficient documentation

## 2024-03-07 DIAGNOSIS — E782 Mixed hyperlipidemia: Secondary | ICD-10-CM | POA: Diagnosis not present

## 2024-03-07 DIAGNOSIS — R972 Elevated prostate specific antigen [PSA]: Secondary | ICD-10-CM

## 2024-03-07 NOTE — Progress Notes (Unsigned)
   Subjective:    Patient ID: Marcus Phillips, male    DOB: [birth year], 58 y.o.   MRN: [id number]  DOS:  03/07/2024 Type of visit - description: Follow-up  Since the last office visit is doing well.   Review of Systems See above   Past Medical History:  Diagnosis Date   DDD (degenerative disc disease), lumbar    Hyperlipidemia    seasonal allergies     Past Surgical History:  Procedure Laterality Date   BACK SURGERY  2011   repair of L5 disc   WISDOM TOOTH EXTRACTION      Current Outpatient Medications  Medication Instructions   celecoxib (CELEBREX) 200 mg, Daily PRN   cetirizine (ZYRTEC) 10 mg, Daily   DULoxetine (CYMBALTA) 30 MG capsule Take by mouth.   Multiple Vitamins-Minerals (MULTIVITAMIN ADULTS PO) 1 tablet, Daily   Omega-3 Fatty Acids (OMEGA-3 FISH OIL) 1200 MG CAPS Take by mouth.   pantoprazole (PROTONIX) 40 MG tablet 1 tablet, Daily       Objective:   Physical Exam BP 126/76   Pulse 67   Temp 98.1 F (36.7 C) (Oral)   Resp 16   Ht 5\' 10"  (1.778 m)   Wt 203 lb 6 oz (92.3 kg)   SpO2 97%   BMI 29.18 kg/m  General:   Well developed, NAD, BMI noted. HEENT:  Normocephalic . Face symmetric, atraumatic Neurologic:  alert & oriented X3.  Speech normal, gait appropriate for age and unassisted Psych--  Cognition and judgment appear intact.  Cooperative with normal attention span and concentration.  Behavior appropriate. No anxious or depressed appearing.      Assessment    Assessment (new patient 02-2023) Dyslipidemia [facility] [place] dx remotely  DJD  Back injury , Back surgery 2011 ([facility] - on cymbalta  PLAN Labs 03/01/2024. Total cholesterol 236, LDL 164. PSA 4.5 still elevated.  UA urine culture negative. Dyslipidemia: Cholesterol moderately elevated, 10-year cardiovascular risk is 5.8.  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  I ask him to come back in October for  FLP. Increased PSA: PSA abnormal x 2, denies any symptoms, no FH.  Referred to urology Neck pain, left arm numbness: Per neurology. RTC labs 09-2024. RTC CPX 11/2024

## 2024-03-07 NOTE — Patient Instructions (Addendum)
  Please go to the front desk: Arrange a lab appointment bu 09/2024 Arrange for a physical exam by 11/2024

## 2024-03-08 NOTE — Assessment & Plan Note (Signed)
 Labs 03/01/2024. Total cholesterol 236, LDL 164. PSA 4.5 still elevated.  UA urine culture negative. Dyslipidemia: Cholesterol moderately elevated, 10-year cardiovascular risk is 5.8.  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  I ask him to come back in October for FLP. Increased PSA: PSA abnormal x 2, denies any symptoms, no FH.  Referred to urology Neck pain, left arm numbness: Per neurology. RTC labs 09-2024. RTC CPX 11/2024

## 2024-07-30 ENCOUNTER — Telehealth: Payer: Self-pay | Admitting: Internal Medicine

## 2024-07-30 DIAGNOSIS — R972 Elevated prostate specific antigen [PSA]: Secondary | ICD-10-CM

## 2024-07-30 NOTE — Telephone Encounter (Signed)
 Please reach out to the patient, was referred to urology due to increased PSA.  Has he been able to see urology?

## 2024-07-31 NOTE — Telephone Encounter (Signed)
Mychart message sent. Urology referral placed

## 2024-07-31 NOTE — Addendum Note (Signed)
 Addended by: Ramani Riva D on: 07/31/2024 10:26 AM   Modules accepted: Orders

## 2024-07-31 NOTE — Telephone Encounter (Signed)
 1.  Place another referral 2.  Call or send a letter: I recommend him to call urology and schedule an appointment

## 2024-07-31 NOTE — Telephone Encounter (Signed)
 Pt was sent upstairs to urology. He did not schedule.

## 2024-10-23 ENCOUNTER — Other Ambulatory Visit (INDEPENDENT_AMBULATORY_CARE_PROVIDER_SITE_OTHER)

## 2024-10-23 DIAGNOSIS — E782 Mixed hyperlipidemia: Secondary | ICD-10-CM | POA: Diagnosis not present

## 2024-10-23 LAB — LIPID PANEL
Cholesterol: 233 mg/dL — ABNORMAL HIGH (ref 0–200)
HDL: 57.6 mg/dL (ref >39.00)
LDL Cholesterol: 162 mg/dL — ABNORMAL HIGH (ref 0–99)
NonHDL: 175.15
Total CHOL/HDL Ratio: 4
Triglycerides: 64 mg/dL (ref 0.0–149.0)
VLDL: 12.8 mg/dL (ref 0.0–40.0)

## 2024-10-25 ENCOUNTER — Ambulatory Visit: Payer: Self-pay | Admitting: Family

## 2024-10-29 ENCOUNTER — Other Ambulatory Visit: Payer: Self-pay | Admitting: Family

## 2024-10-29 MED ORDER — SIMVASTATIN 10 MG PO TABS: 10.0000 mg | ORAL_TABLET | Freq: Every day | ORAL | 3 refills | Status: DC

## 2024-11-21 ENCOUNTER — Encounter: Payer: 59 | Admitting: Internal Medicine

## 2024-11-21 ENCOUNTER — Encounter: Admitting: Internal Medicine

## 2024-11-22 ENCOUNTER — Ambulatory Visit: Admitting: Family

## 2024-11-22 ENCOUNTER — Encounter: Payer: Self-pay | Admitting: Family

## 2024-11-22 VITALS — BP 134/88 | HR 61 | Temp 97.6°F | Resp 16 | Ht 70.0 in | Wt 206.8 lb

## 2024-11-22 DIAGNOSIS — E782 Mixed hyperlipidemia: Secondary | ICD-10-CM | POA: Diagnosis not present

## 2024-11-22 DIAGNOSIS — R972 Elevated prostate specific antigen [PSA]: Secondary | ICD-10-CM

## 2024-11-22 DIAGNOSIS — Z0000 Encounter for general adult medical examination without abnormal findings: Secondary | ICD-10-CM

## 2024-11-22 LAB — CBC WITH DIFFERENTIAL/PLATELET
Basophils Absolute: 0 K/uL (ref 0.0–0.1)
Basophils Relative: 0.5 % (ref 0.0–3.0)
Eosinophils Absolute: 0.3 K/uL (ref 0.0–0.7)
Eosinophils Relative: 4.5 % (ref 0.0–5.0)
HCT: 44.8 % (ref 39.0–52.0)
Hemoglobin: 14.9 g/dL (ref 13.0–17.0)
Lymphocytes Relative: 24.5 % (ref 12.0–46.0)
Lymphs Abs: 1.4 K/uL (ref 0.7–4.0)
MCHC: 33.3 g/dL (ref 30.0–36.0)
MCV: 85.9 fl (ref 78.0–100.0)
Monocytes Absolute: 0.6 K/uL (ref 0.1–1.0)
Monocytes Relative: 11.3 % (ref 3.0–12.0)
Neutro Abs: 3.4 K/uL (ref 1.4–7.7)
Neutrophils Relative %: 59.2 % (ref 43.0–77.0)
Platelets: 208 K/uL (ref 150.0–400.0)
RBC: 5.22 Mil/uL (ref 4.22–5.81)
RDW: 13.6 % (ref 11.5–15.5)
WBC: 5.7 K/uL (ref 4.0–10.5)

## 2024-11-22 LAB — COMPREHENSIVE METABOLIC PANEL WITH GFR
ALT: 37 U/L (ref 3–53)
AST: 26 U/L (ref 5–37)
Albumin: 4.4 g/dL (ref 3.5–5.2)
Alkaline Phosphatase: 56 U/L (ref 39–117)
BUN: 20 mg/dL (ref 6–23)
CO2: 26 meq/L (ref 19–32)
Calcium: 9.5 mg/dL (ref 8.4–10.5)
Chloride: 104 meq/L (ref 96–112)
Creatinine, Ser: 0.78 mg/dL (ref 0.40–1.50)
GFR: 99.7 mL/min (ref >60.00)
Glucose, Bld: 132 mg/dL — ABNORMAL HIGH (ref 70–99)
Potassium: 4.5 meq/L (ref 3.5–5.1)
Sodium: 139 meq/L (ref 135–145)
Total Bilirubin: 1 mg/dL (ref 0.2–1.2)
Total Protein: 6.7 g/dL (ref 6.0–8.3)

## 2024-11-22 LAB — LIPID PANEL
Cholesterol: 173 mg/dL (ref 28–200)
HDL: 54 mg/dL (ref >39.00)
LDL Cholesterol: 90 mg/dL (ref 10–99)
NonHDL: 118.55
Total CHOL/HDL Ratio: 3
Triglycerides: 145 mg/dL (ref 10.0–149.0)
VLDL: 29 mg/dL (ref 0.0–40.0)

## 2024-11-22 LAB — PSA: PSA: 3.78 ng/mL (ref 0.10–4.00)

## 2024-11-22 LAB — TSH: TSH: 1.56 u[IU]/mL (ref 0.35–5.50)

## 2024-11-22 NOTE — Progress Notes (Signed)
 Complete physical exam  Patient: Marcus Phillips   DOB: July 28, 1966   58 y.o. Male  MRN: [id number]  Subjective:    Chief Complaint  Patient presents with   Annual Exam    Patient is here for his physical exam    Marcus Phillips is a 58 y.o. male who presents today for a complete physical exam. He reports consuming a general diet.  Patient reports that he walks all day every day.  He generally feels well. He reports sleeping well. He does not have additional problems to discuss today.  He is tolerating his medications well no concerns.  Declines flu shot.   Most recent fall risk assessment:    11/22/2024    8:12 AM  Fall Risk   Falls in the past year? 0  Number falls in past yr: 0  Injury with Fall? 0  Risk for fall due to : No Fall Risks  Follow up Falls evaluation completed     Most recent depression screenings:    11/22/2024    8:12 AM 03/07/2024    1:01 PM  PHQ 2/9 Scores  PHQ - 2 Score 0 0        Patient Care Team: Amon Aloysius BRAVO, MD as PCP - General (Internal Medicine) Verdia Lombard, MD as Consulting Physician (Internal Medicine)   Show/hide medication list[1]  Review of Systems  All other systems reviewed and are negative.   Past Medical History:  Diagnosis Date   DDD (degenerative disc disease), lumbar    History of Guillain-Barre syndrome    Hyperlipidemia    seasonal allergies     Social History   Socioeconomic History   Marital status: Married    Spouse name: Devere   Number of children: 2   Years of education: Not on file   Highest education level: Not on file  Occupational History   Occupation: Curator    Comment: [employer]  Tobacco Use   Smoking status: Never   Smokeless tobacco: Never  Substance and Sexual Activity   Alcohol use: No   Drug use: No   Sexual activity: Yes    Partners: Female    Comment: wife  Other Topics Concern   Not on file  Social History Narrative   Originally from [place] . Moved here in 1986    Social Drivers of Health   Tobacco Use: Low Risk (11/22/2024)   Patient History    Smoking Tobacco Use: Never    Smokeless Tobacco Use: Never    Passive Exposure: Not on file  Financial Resource Strain: Not on file  Food Insecurity: Not on file  Transportation Needs: Not on file  Physical Activity: Not on file  Stress: Not on file  Social Connections: Not on file  Intimate Partner Violence: Not on file  Depression (PHQ2-9): Low Risk (11/22/2024)   Depression (PHQ2-9)    PHQ-2 Score: 0  Alcohol Screen: Not on file  Housing: Not on file  Utilities: Not on file  Health Literacy: Not on file    Past Surgical History:  Procedure Laterality Date   BACK SURGERY  2011   repair of L5 disc   WISDOM TOOTH EXTRACTION      Family History  Problem Relation Age of Onset   Diabetes Father    Cancer Maternal Grandmother        Breast   Heart attack Maternal Grandmother 66       was a smoker    Healthy Child  x 2 -- 1 boy and 1 girl   Colon cancer Neg Hx    Prostate cancer Neg Hx     Allergies[2]  Medications Ordered Prior to Encounter[3]  BP 134/88 (BP Location: Left Arm, Patient Position: Sitting, Cuff Size: Normal)   Pulse 61   Temp 97.6 F (36.4 C) (Oral)   Resp 16   Ht 5' 10 (1.778 m)   Wt 206 lb 12.8 oz (93.8 kg)   SpO2 98%   BMI 29.67 kg/m chart       Objective:     BP 134/88 (BP Location: Left Arm, Patient Position: Sitting, Cuff Size: Normal)   Pulse 61   Temp 97.6 F (36.4 C) (Oral)   Resp 16   Ht 5' 10 (1.778 m)   Wt 206 lb 12.8 oz (93.8 kg)   SpO2 98%   BMI 29.67 kg/m    Physical Exam Vitals and nursing note reviewed.  Constitutional:      Appearance: Normal appearance. He is normal weight.  HENT:     Head: Normocephalic and atraumatic.     Right Ear: Tympanic membrane, ear canal and external ear normal.     Left Ear: Tympanic membrane, ear canal and external ear normal.     Nose: Nose normal.     Mouth/Throat:     Mouth:  Mucous membranes are dry.     Pharynx: Oropharynx is clear.  Eyes:     Extraocular Movements: Extraocular movements intact.     Conjunctiva/sclera: Conjunctivae normal.     Pupils: Pupils are equal, round, and reactive to light.  Cardiovascular:     Rate and Rhythm: Normal rate and regular rhythm.  Pulmonary:     Effort: Pulmonary effort is normal.     Breath sounds: Normal breath sounds.  Abdominal:     General: Abdomen is flat. Bowel sounds are normal.     Palpations: Abdomen is soft.  Musculoskeletal:        General: Normal range of motion.     Cervical back: Normal range of motion and neck supple.  Skin:    General: Skin is warm and dry.  Neurological:     General: No focal deficit present.     Mental Status: He is alert and oriented to person, place, and time. Mental status is at baseline.  Psychiatric:        Mood and Affect: Mood normal.        Behavior: Behavior normal.        Thought Content: Thought content normal.        Judgment: Judgment normal.      No results found for any visits on 11/22/24.     Assessment & Plan:    Routine Health Maintenance and Physical Exam  Immunization History  Administered Date(s) Administered   Influenza,inj,Quad PF,6+ Mos 11/21/2016   Td 05/02/2009    Health Maintenance  Topic Date Due   Hepatitis C Screening  Never done   Hepatitis B Vaccines 19-59 Average Risk (1 of 3 - 19+ 3-dose series) Never done   Pneumococcal Vaccine: 50+ Years (1 of 1 - PCV) Never done   Fecal DNA (Cologuard)  02/01/2022   HIV Screening  07/15/2026 (Originally 05/24/1981)   HPV VACCINES  Aged Out   Meningococcal B Vaccine  Aged Out   DTaP/Tdap/Td  Discontinued   COVID-19 Vaccine  Discontinued   Zoster Vaccines- Shingrix  Discontinued    Discussed health benefits of physical activity, and encouraged him to  engage in regular exercise appropriate for his age and condition.  Problem List Items Addressed This Visit     Hyperlipidemia, mixed    Relevant Orders   PSA   Comp Met (CMET)   Lipid panel   CBC w/Diff   TSH   Annual physical exam - Primary   Relevant Orders   PSA   Comp Met (CMET)   Lipid panel   CBC w/Diff   TSH   Other Visit Diagnoses       Elevated PSA       Relevant Orders   PSA   Comp Met (CMET)   Lipid panel   CBC w/Diff   TSH      Return in about 6 months (around 05/23/2025).    Call the office with any questions or concerns.  Encouraged healthy diet, exercise.  Follow-up in 6 months with PCP and sooner as needed.  Declines influenza vaccine. Chyann Ambrocio B Janis Cuffe, FNP      [1]  Outpatient Medications Prior to Visit  Medication Sig   celecoxib (CELEBREX) 200 MG capsule Take 200 mg by mouth daily as needed.   cetirizine (ZYRTEC) 10 MG tablet Take 10 mg by mouth daily.   DULoxetine (CYMBALTA) 30 MG capsule Take by mouth.   Multiple Vitamins-Minerals (MULTIVITAMIN ADULTS PO) Take 1 tablet by mouth daily.   Omega-3 Fatty Acids (OMEGA-3 FISH OIL) 1200 MG CAPS Take by mouth.   pantoprazole (PROTONIX) 40 MG tablet Take 1 tablet by mouth daily.   simvastatin  (ZOCOR ) 10 MG tablet Take 1 tablet (10 mg total) by mouth at bedtime.   No facility-administered medications prior to visit.  [2] No Known Allergies [3]  Current Outpatient Medications on File Prior to Visit  Medication Sig Dispense Refill   celecoxib (CELEBREX) 200 MG capsule Take 200 mg by mouth daily as needed.     cetirizine (ZYRTEC) 10 MG tablet Take 10 mg by mouth daily.     DULoxetine (CYMBALTA) 30 MG capsule Take by mouth.     Multiple Vitamins-Minerals (MULTIVITAMIN ADULTS PO) Take 1 tablet by mouth daily.     Omega-3 Fatty Acids (OMEGA-3 FISH OIL) 1200 MG CAPS Take by mouth.     pantoprazole (PROTONIX) 40 MG tablet Take 1 tablet by mouth daily.     simvastatin  (ZOCOR ) 10 MG tablet Take 1 tablet (10 mg total) by mouth at bedtime. 30 tablet 3   No current facility-administered medications on file prior to visit.

## 2024-11-23 ENCOUNTER — Encounter: Payer: 59 | Admitting: Internal Medicine

## 2024-11-25 ENCOUNTER — Ambulatory Visit: Payer: Self-pay | Admitting: Family

## 2024-12-08 ENCOUNTER — Encounter: Payer: Self-pay | Admitting: Internal Medicine

## 2024-12-10 MED ORDER — SIMVASTATIN 10 MG PO TABS: 10.0000 mg | ORAL_TABLET | Freq: Every day | ORAL | 1 refills | Status: AC

## 2025-05-28 ENCOUNTER — Ambulatory Visit: Admitting: Internal Medicine
